# Patient Record
Sex: Female | Born: 1995 | Race: White | Hispanic: No | Marital: Single | Smoking: Current every day smoker
Health system: Southern US, Community
[De-identification: ages and names within clinical notes are randomized; demographics above are authoritative.]

## PROBLEM LIST (undated history)

## (undated) HISTORY — PX: NO PAST SURGERIES: SHX2092

---

## 2017-12-27 ENCOUNTER — Ambulatory Visit: Payer: BLUE CROSS/BLUE SHIELD | Admitting: Allergy and Immunology

## 2017-12-27 ENCOUNTER — Encounter (HOSPITAL_COMMUNITY): Payer: Self-pay

## 2017-12-27 ENCOUNTER — Emergency Department (HOSPITAL_COMMUNITY)
Admission: EM | Admit: 2017-12-27 | Discharge: 2017-12-28 | Disposition: A | Discharge status: Home / Self Care | Payer: BLUE CROSS/BLUE SHIELD | Attending: Emergency Medicine | Admitting: Emergency Medicine

## 2017-12-27 ENCOUNTER — Encounter: Payer: Self-pay | Admitting: Allergy and Immunology

## 2017-12-27 ENCOUNTER — Other Ambulatory Visit: Payer: Self-pay

## 2017-12-27 VITALS — BP 118/64 | HR 98 | Temp 97.7°F | Resp 18 | Ht 62.2 in | Wt 140.4 lb

## 2017-12-27 DIAGNOSIS — F17210 Nicotine dependence, cigarettes, uncomplicated: Secondary | ICD-10-CM | POA: Diagnosis not present

## 2017-12-27 DIAGNOSIS — J3089 Other allergic rhinitis: Secondary | ICD-10-CM | POA: Insufficient documentation

## 2017-12-27 DIAGNOSIS — L500 Allergic urticaria: Secondary | ICD-10-CM | POA: Diagnosis not present

## 2017-12-27 DIAGNOSIS — T7840XA Allergy, unspecified, initial encounter: Secondary | ICD-10-CM

## 2017-12-27 DIAGNOSIS — R1031 Right lower quadrant pain: Secondary | ICD-10-CM | POA: Diagnosis present

## 2017-12-27 DIAGNOSIS — R110 Nausea: Secondary | ICD-10-CM | POA: Insufficient documentation

## 2017-12-27 DIAGNOSIS — J310 Chronic rhinitis: Secondary | ICD-10-CM | POA: Diagnosis not present

## 2017-12-27 LAB — COMPREHENSIVE METABOLIC PANEL
ALT: 11 U/L — AB (ref 14–54)
AST: 19 U/L (ref 15–41)
Albumin: 4.8 g/dL (ref 3.5–5.0)
Alkaline Phosphatase: 68 U/L (ref 38–126)
Anion gap: 10 (ref 5–15)
BUN: 10 mg/dL (ref 6–20)
CHLORIDE: 105 mmol/L (ref 101–111)
CO2: 23 mmol/L (ref 22–32)
CREATININE: 0.88 mg/dL (ref 0.44–1.00)
Calcium: 10.1 mg/dL (ref 8.9–10.3)
GFR calc Af Amer: >60 mL/min (ref >60)
GFR calc non Af Amer: >60 mL/min (ref >60)
Glucose, Bld: 102 mg/dL — ABNORMAL HIGH (ref 65–99)
Potassium: 4 mmol/L (ref 3.5–5.1)
Sodium: 138 mmol/L (ref 135–145)
Total Bilirubin: 0.7 mg/dL (ref 0.3–1.2)
Total Protein: 8.6 g/dL — ABNORMAL HIGH (ref 6.5–8.1)

## 2017-12-27 LAB — LIPASE, BLOOD: LIPASE: 27 U/L (ref 11–51)

## 2017-12-27 LAB — I-STAT BETA HCG BLOOD, ED (MC, WL, AP ONLY)

## 2017-12-27 LAB — CBC
HCT: 43.8 % (ref 36.0–46.0)
Hemoglobin: 15.6 g/dL — ABNORMAL HIGH (ref 12.0–15.0)
MCH: 30.4 pg (ref 26.0–34.0)
MCHC: 35.6 g/dL (ref 30.0–36.0)
MCV: 85.4 fL (ref 78.0–100.0)
PLATELETS: 323 10*3/uL (ref 150–400)
RBC: 5.13 MIL/uL — ABNORMAL HIGH (ref 3.87–5.11)
RDW: 12 % (ref 11.5–15.5)
WBC: 9 10*3/uL (ref 4.0–10.5)

## 2017-12-27 MED ORDER — ONDANSETRON HCL 4 MG/2ML IJ SOLN
4.0000 mg | Freq: Once | INTRAMUSCULAR | Status: AC
  Administered 2017-12-28: 4 mg via INTRAVENOUS
  Filled 2017-12-27: qty 2

## 2017-12-27 MED ORDER — MORPHINE SULFATE (PF) 4 MG/ML IV SOLN
4.0000 mg | Freq: Once | INTRAVENOUS | Status: AC
  Administered 2017-12-28: 4 mg via INTRAVENOUS
  Filled 2017-12-27: qty 1

## 2017-12-27 MED ORDER — EPINEPHRINE 0.3 MG/0.3ML IJ SOAJ: 0.3000 mg | Freq: Once | INTRAMUSCULAR | 1 refills | Status: AC

## 2017-12-27 NOTE — ED Triage Notes (Signed)
States for about 2 days right lower quad pain worse tonight no fever noted states severe pain. No dysuria or vag discharge noted.

## 2017-12-27 NOTE — ED Provider Notes (Signed)
[facility]-EMERGENCY DEPT Provider Note   CSN: [id number] Arrival date & time: 12/27/17  2307     History   Chief Complaint Chief Complaint  Patient presents with  . Abdominal Pain    HPI Savannah Schmidt is a 22 y.o. female.  The history is provided by the patient and medical records.  Abdominal Pain   Associated symptoms include nausea.     22 year old female with history of seasonal allergies and chronic rhinitis, presenting to the ED with abdominal pain.  Reports 2 days ago she had some mildly diffuse abdominal pain which she thought was due to gas.  States over the past 24 hours pain seems more localized to her right lower abdomen.  States she was okay this morning but throughout the day today pain has significantly worsened and she had to leave work early.  States riding in the car was almost unbearable as she hit bumps in the road.  Pain also worse with standing upright and walking around, better when curled into the fetal position.  She reports some nausea and decreased appetite but no vomiting or diarrhea.  Had a fever last week secondary to a cold but that is resolved.  She denies any urinary symptoms, pelvic pain, or vaginal discharge.  No prior abdominal surgeries.  History reviewed. No pertinent past medical history.  Patient Active Problem List   Diagnosis Date Noted  . 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 Allergic reaction 12/27/2017  . Chronic rhinitis 12/27/2017    Past Surgical History:  Procedure Laterality Date  . NO PAST SURGERIES      OB History    No data available       Home Medications    Prior to Admission medications   Medication Sig Start Date End Date Taking? Authorizing Provider  cetirizine (ZYRTEC) 10 MG tablet Take 10 mg by mouth daily as needed for allergies.    [provider]  diphenhydrAMINE (BENADRYL) 25 MG tablet Take 50 mg by mouth every 4 (four) hours as needed for allergies (for allergic reaction).     [provider]  EPINEPHrine (AUVI-Q) 0.3 mg/0.3 mL IJ SOAJ injection Inject 0.3 mLs (0.3 mg total) into the muscle once for 1 dose. 12/27/17 12/27/17  Bobbitt, Heywood Iles, MD    Family History Family History  Problem Relation Age of Onset  . Allergic rhinitis Mother   . Allergic rhinitis Sister     Social History Social History   Tobacco Use  . Smoking status: Current Every Day Smoker    Packs/day: 0.50    Years: 5.00    Pack years: 2.50    Types: Cigarettes  . Smokeless tobacco: Never Used  Substance Use Topics  . Alcohol use: Yes    Alcohol/week: 4.8 oz    Types: 8 Glasses of wine per week  . Drug use: Yes    Types: Cocaine     Allergies   Azithromycin and Levaquin [levofloxacin]   Review of Systems Review of Systems  Constitutional: Positive for appetite change.  Gastrointestinal: Positive for abdominal pain and nausea.  All other systems reviewed and are negative.    Physical Exam Updated Vital Signs BP 117/86   Pulse 91   Temp 97.8 F (36.6 C) (Oral)   Resp (!) 24   Ht 5\' 2"  (1.575 m)   Wt 63.5 kg (140 lb)   SpO2 100%   BMI 25.61 kg/m   Physical Exam  Constitutional: She is oriented to person, place, and  time. She appears well-developed and well-nourished.  HENT:  Head: Normocephalic and atraumatic.  Mouth/Throat: Oropharynx is clear and moist.  Eyes: Conjunctivae and EOM are normal. Pupils are equal, round, and reactive to light.  Neck: Normal range of motion.  Cardiovascular: Normal rate, regular rhythm and normal heart sounds.  Pulmonary/Chest: Effort normal and breath sounds normal.  Abdominal: Soft. Bowel sounds are normal. There is tenderness in the right lower quadrant. There is tenderness at McBurney's point.  Musculoskeletal: Normal range of motion.  Neurological: She is alert and oriented to person, place, and time.  Skin: Skin is warm and dry.  Psychiatric: She has a normal mood and affect.  Nursing note and vitals  reviewed.    ED Treatments / Results  Labs (all labs ordered are listed, but only abnormal results are displayed) Labs Reviewed  COMPREHENSIVE METABOLIC PANEL - Abnormal; Notable for the following components:      Result Value   Glucose, Bld 102 (*)    Total Protein 8.6 (*)    ALT 11 (*)    All other components within normal limits  CBC - Abnormal; Notable for the following components:   RBC 5.13 (*)    Hemoglobin 15.6 (*)    All other components within normal limits  LIPASE, BLOOD  URINALYSIS, ROUTINE W REFLEX MICROSCOPIC  I-STAT BETA HCG BLOOD, ED ([facility], [facility], [facility] ONLY)    EKG  EKG Interpretation None       Radiology Ct Abdomen Pelvis W Contrast  Result Date: 12/28/2017 CLINICAL DATA:  Two day history of right lower quadrant pain, worse tonight. EXAM: CT ABDOMEN AND PELVIS WITH CONTRAST TECHNIQUE: Multidetector CT imaging of the abdomen and pelvis was performed using the standard protocol following bolus administration of intravenous contrast. CONTRAST:  100mL ISOVUE-300 IOPAMIDOL (ISOVUE-300) INJECTION 61% COMPARISON:  None. FINDINGS: Lower chest: Lung bases are clear. Hepatobiliary: No focal liver abnormality is seen. No gallstones, gallbladder wall thickening, or biliary dilatation. Pancreas: Unremarkable. No pancreatic ductal dilatation or surrounding inflammatory changes. Spleen: Normal in size without focal abnormality. Adrenals/Urinary Tract: Adrenal glands are unremarkable. Kidneys are normal, without renal calculi, focal lesion, or hydronephrosis. Bladder is unremarkable. Stomach/Bowel: Stomach, small bowel, and colon are not abnormally distended. No wall thickening is appreciated. No inflammatory stranding. Appendix is normal. Vascular/Lymphatic: No significant vascular findings are present. No enlarged abdominal or pelvic lymph nodes. Reproductive: Uterus and bilateral adnexa are unremarkable. Other: No abdominal wall hernia or abnormality. No abdominopelvic ascites.  Musculoskeletal: No acute or significant osseous findings. IMPRESSION: No acute process demonstrated in the abdomen or pelvis. No evidence of bowel obstruction or inflammation. Appendix is normal. Electronically Signed   By: Burman NievesWilliam  Stevens M.D.   On: 12/28/2017 00:49    Procedures Procedures (including critical care time)  Medications Ordered in ED Medications  morphine 4 MG/ML injection 4 mg (4 mg Intravenous Given 12/28/17 0000)  ondansetron (ZOFRAN) injection 4 mg (4 mg Intravenous Given 12/28/17 0000)  iopamidol (ISOVUE-300) 61 % injection (100 mLs  Contrast Given 12/28/17 0012)     Initial Impression / Assessment and Plan / ED Course  I have reviewed the triage vital signs and the nursing notes.  Pertinent labs & imaging results that were available during my care of the patient were reviewed by me and considered in my medical decision making (see chart for details).  22 year old female here with right lower quadrant abdominal pain over the past 2 days.  Has been progressively worsening.  No urinary symptoms, pelvic pain, or  vaginal discharge.  She is afebrile and nontoxic but does appear uncomfortable.  Tenderness in the right lower quadrant on exam.  Screening labs overall reassuring.  CT scan was obtained, no acute findings.  Patient feeling better after medications here.  Feel she is stable for discharge home.  She understands to monitor her symptoms over the next few days, seek reevaluation if any new or acute changes.  She was discharged home in stable condition.  Final Clinical Impressions(s) / ED Diagnoses   Final diagnoses:  Right lower quadrant abdominal pain    ED Discharge Orders        Ordered    ibuprofen (ADVIL,MOTRIN) 800 MG tablet  3 times daily     12/28/17 0150    ondansetron (ZOFRAN ODT) 4 MG disintegrating tablet  Every 8 hours PRN     12/28/17 0150       Garlon Hatchet, PA-C 12/28/17 1610    Devoria Albe, MD 12/28/17 [phone]

## 2017-12-27 NOTE — Assessment & Plan Note (Signed)
The patient's history suggests allergic rhinoconjunctivitis.  Recommendations will be made after she returns for allergy skin testing in the near future.

## 2017-12-27 NOTE — ED Notes (Signed)
Bed: [id number] Expected date:  Expected time:  Means of arrival:  Comments: Triage 2 Michelle

## 2017-12-27 NOTE — Assessment & Plan Note (Signed)
The patient's history suggests anaphylaxis with an unclear etiology.  Because she is in the 6-week refractory period of this reaction we were unable to proceed with skin testing today.  She will return to the clinic for the skin testing after she is outside of the 6 weeks and off of antihistamines for at least 3 days.  An emergency allergy action plan has been provided and discussed.  A prescription has been provided for epinephrine 0.3 mg autoinjector 2 pack along with instructions for its proper administration.  Should symptoms recur, a journal is to be kept recording any foods eaten, beverages consumed, and medications taken within a 6 hour time period prior to the onset of symptoms, as well as record activities being performed, and environmental conditions. For any symptoms concerning for anaphylaxis, epinephrine is to be administered and 911 is to be called immediately.

## 2017-12-27 NOTE — Patient Instructions (Addendum)
Allergic reaction The patient's history suggests anaphylaxis with an unclear etiology.  Because she is in the 6-week refractory period of this reaction we were unable to proceed with skin testing today.  She will return to the clinic for the skin testing after she is outside of the 6 weeks and off of antihistamines for at least 3 days.  An emergency allergy action plan has been provided and discussed.  A prescription has been provided for epinephrine 0.3 mg autoinjector 2 pack along with instructions for its proper administration.  Should symptoms recur, a journal is to be kept recording any foods eaten, beverages consumed, and medications taken within a 6 hour time period prior to the onset of symptoms, as well as record activities being performed, and environmental conditions. For any symptoms concerning for anaphylaxis, epinephrine is to be administered and 911 is to be called immediately.  Chronic rhinitis The patient's history suggests allergic rhinoconjunctivitis.  Recommendations will be made after she returns for allergy skin testing in the near future.   Return for allergy skin testing.

## 2017-12-27 NOTE — Progress Notes (Signed)
New Patient Note  RE: Savannah Schmidt Beery MRN: [id number] DOB: 01-Nov-1996 Date of Office Visit: 12/27/2017  Referring provider: No ref. provider found Primary care provider: Patient, No Pcp Per  Chief Complaint: Allergic Reaction; Urticaria; and Allergic Rhinitis    History of present illness: Savannah Schmidt Cove is a 22 y.o. female presenting today for evaluation of an allergic reaction.  She reports that during the evening 3 weeks ago she consumed sourdough bread while working at [employer] where she works.  After consuming the bread, she states that her "whole face broke out in hives" and that her face and neck were "burning and swollen" in addition, she developed the sensation of throat tightness and difficulty breathing.  She took 100 mg of diphenhydramine and the symptoms began to resolve over the course of the next hour, however her face was still blotchy a few hours later.  She did not go to the emergency department because she was concerned about losing her job.  She reports that she had not consumed any food earlier in the day.  She denies having taken any medication that day and denies insect sting/bites.  Since that time, she has tried to consume small amounts of bread or crackers and has gotten "minor digestive issues", such as gas, bloating, and constipation.  She does state that over the past year when she consumes a large quantity of bread her hands become itchy and swollen. Rachel experiences severe nasal congestion, ocular pruritus, and periorbital edema.  The symptoms occur predominantly in the spring and the fall.  She attempts to control the symptoms with cetirizine and over-the-counter eyedrops.   Assessment and plan: Allergic reaction The patient's history suggests anaphylaxis with an unclear etiology.  Because she is in the 6-week refractory period of this reaction we were unable to proceed with skin testing today.  She will return to the clinic for the skin testing after she is  outside of the 6 weeks and off of antihistamines for at least 3 days.  An emergency allergy action plan has been provided and discussed.  A prescription has been provided for epinephrine 0.3 mg autoinjector 2 pack along with instructions for its proper administration.  Should symptoms recur, a journal is to be kept recording any foods eaten, beverages consumed, and medications taken within a 6 hour time period prior to the onset of symptoms, as well as record activities being performed, and environmental conditions. For any symptoms concerning for anaphylaxis, epinephrine is to be administered and 911 is to be called immediately.  Chronic rhinitis The patient's history suggests allergic rhinoconjunctivitis.  Recommendations will be made after she returns for allergy skin testing in the near future.   Meds ordered this encounter  Medications  . EPINEPHrine (AUVI-Q) 0.3 mg/0.3 mL IJ SOAJ injection    Sig: Inject 0.3 mLs (0.3 mg total) into the muscle once for 1 dose.    Dispense:  2 Device    Refill:  1    Physical examination: Blood pressure 118/64, pulse 98, temperature 97.7 F (36.5 C), temperature source Oral, resp. rate 18, height 5' 2.2" (1.58 m), weight 140 lb 6.4 oz (63.7 kg), SpO2 98 %.  General: Alert, interactive, in no acute distress. HEENT: TMs pearly gray, turbinates moderately edematous with clear discharge, post-pharynx mildly erythematous. Neck: Supple without lymphadenopathy. Lungs: Clear to auscultation without wheezing, rhonchi or rales. CV: Normal S1, S2 without murmurs. Abdomen: Nondistended, nontender. Skin: Warm and dry, without lesions or rashes. Extremities:  No clubbing, cyanosis  or edema. Neuro:   Grossly intact.  Review of systems:  Review of systems negative except as noted in HPI / PMHx or noted below: Review of Systems  Constitutional: Negative.   HENT: Negative.   Eyes: Negative.   Respiratory: Negative.   Cardiovascular: Negative.     Gastrointestinal: Negative.   Genitourinary: Negative.   Musculoskeletal: Negative.   Skin: Negative.   Neurological: Negative.   Endo/Heme/Allergies: Negative.   Psychiatric/Behavioral: Negative.     Past medical history:  History reviewed. No pertinent past medical history.  Past surgical history:  Past Surgical History:  Procedure Laterality Date  . NO PAST SURGERIES      Family history: Family History  Problem Relation Age of Onset  . Allergic rhinitis Mother   . Allergic rhinitis Sister     Social history: Social History   Socioeconomic History  . Marital status: Single    Spouse name: Not on file  . Number of children: Not on file  . Years of education: Not on file  . Highest education level: Not on file  Social Needs  . Financial resource strain: Not on file  . Food insecurity - worry: Not on file  . Food insecurity - inability: Not on file  . Transportation needs - medical: Not on file  . Transportation needs - non-medical: Not on file  Occupational History    Employer: [employer]  Tobacco Use  . Smoking status: Current Every Day Smoker    Packs/day: 0.50    Years: 5.00    Pack years: 2.50    Types: Cigarettes  . Smokeless tobacco: Never Used  Substance and Sexual Activity  . Alcohol use: Yes    Alcohol/week: 4.8 oz    Types: 8 Glasses of wine per week  . Drug use: Yes    Types: Cocaine  . Sexual activity: Yes  Other Topics Concern  . Not on file  Social History Narrative  . Not on file   Environmental History: The patient lives in a 24 year old house with carpeting throughout and central air/heat.  There is a dog in the home which has access to her bedroom.  She is a former cigarette smoker having quit in 2013.  Allergies as of 12/27/2017      Reactions   Azithromycin       Medication List        Accurate as of 12/27/17  6:54 PM. Always use your most recent med list.          cetirizine 10 MG tablet Commonly known as:   ZYRTEC Take 10 mg by mouth daily as needed for allergies.   diphenhydrAMINE 25 MG tablet Commonly known as:  BENADRYL Take 50 mg by mouth every 4 (four) hours as needed for allergies (for allergic reaction).   EPINEPHrine 0.3 mg/0.3 mL Soaj injection Commonly known as:  AUVI-Q Inject 0.3 mLs (0.3 mg total) into the muscle once for 1 dose.       Known medication allergies: Allergies  Allergen Reactions  . Azithromycin     I appreciate the opportunity to take part in Dameisha's care. Please do not hesitate to contact me with questions.  Sincerely,   R. Jorene Guest, MD

## 2017-12-28 ENCOUNTER — Emergency Department (HOSPITAL_COMMUNITY): Payer: BLUE CROSS/BLUE SHIELD

## 2017-12-28 MED ORDER — IOPAMIDOL (ISOVUE-300) INJECTION 61%
INTRAVENOUS | Status: AC
Start: 2017-12-28 — End: 2017-12-28
  Administered 2017-12-28: 100 mL
  Filled 2017-12-28: qty 100

## 2017-12-28 MED ORDER — ONDANSETRON 4 MG PO TBDP: 4.0000 mg | ORAL_TABLET | Freq: Three times a day (TID) | ORAL | 0 refills | Status: AC | PRN

## 2017-12-28 MED ORDER — IBUPROFEN 800 MG PO TABS: 800.0000 mg | ORAL_TABLET | Freq: Three times a day (TID) | ORAL | 0 refills | Status: AC

## 2017-12-28 NOTE — Discharge Instructions (Signed)
Keep an eye on your symptoms, take medications to help with them. Follow-up with your primary care doctor. You can return here for any new/acute changes.

## 2018-01-17 ENCOUNTER — Ambulatory Visit: Payer: BLUE CROSS/BLUE SHIELD | Admitting: Allergy and Immunology

## 2018-01-17 ENCOUNTER — Encounter: Payer: Self-pay | Admitting: Allergy and Immunology

## 2018-01-17 VITALS — BP 110/60 | HR 98 | Temp 97.7°F | Resp 18

## 2018-01-17 DIAGNOSIS — T7840XD Allergy, unspecified, subsequent encounter: Secondary | ICD-10-CM

## 2018-01-17 DIAGNOSIS — H1013 Acute atopic conjunctivitis, bilateral: Secondary | ICD-10-CM | POA: Diagnosis not present

## 2018-01-17 DIAGNOSIS — J3089 Other allergic rhinitis: Secondary | ICD-10-CM | POA: Diagnosis not present

## 2018-01-17 DIAGNOSIS — L500 Allergic urticaria: Secondary | ICD-10-CM | POA: Diagnosis not present

## 2018-01-17 DIAGNOSIS — H1010 Acute atopic conjunctivitis, unspecified eye: Secondary | ICD-10-CM | POA: Insufficient documentation

## 2018-01-17 MED ORDER — OLOPATADINE HCL 0.7 % OP SOLN: 1.0000 [drp] | OPHTHALMIC | 5 refills | Status: AC

## 2018-01-17 MED ORDER — LEVOCETIRIZINE DIHYDROCHLORIDE 5 MG PO TABS: 5.0000 mg | ORAL_TABLET | Freq: Every evening | ORAL | 5 refills | Status: AC

## 2018-01-17 MED ORDER — EPINEPHRINE 0.3 MG/0.3ML IJ SOAJ: 0.3000 mg | Freq: Once | INTRAMUSCULAR | 1 refills | Status: AC

## 2018-01-17 MED ORDER — FLUTICASONE PROPIONATE 93 MCG/ACT NA EXHU: 2.0000 | INHALANT_SUSPENSION | Freq: Two times a day (BID) | NASAL | 5 refills | Status: DC

## 2018-01-17 NOTE — Assessment & Plan Note (Addendum)
   Aeroallergen avoidance measures have been discussed and provided in written form.  A prescription has been provided for levocetirizine, 5 mg daily as needed.  A prescription has been provided for Xhance, 2 actuations per nostril twice a day. Proper technique has been discussed and demonstrated.  Nasal saline spray (i.e., Simply Saline) or nasal saline lavage (i.e., NeilMed) is recommended as needed and prior to medicated nasal sprays.  The risks and benefits of aeroallergen immunotherapy have been discussed. The patient is interested in the possibility of initiating immunotherapy if insurance coverage is favorable. She will let us know how she would like to proceed. 

## 2018-01-17 NOTE — Assessment & Plan Note (Signed)
   Treatment plan as outlined above for allergic rhinitis.  A prescription has been provided for Pazeo, one drop per eye daily as needed.  I have also recommended eye lubricant drops (i.e., Natural Tears) as needed. 

## 2018-01-17 NOTE — Assessment & Plan Note (Addendum)
The patients history suggests allergic reaction with an unclear trigger. With the exception of borderling/equivocal reactivity to hops, food allergen skin tests were negative today despite a positive histamine control. As the patient is able to consume foods/beverages with hops without symptoms, this represents a false positive result. We will proceed with in vitro lab studies to help establish an etiology.  The following labs have been ordered: FCeRI antibody, TSH, anti-thyroglobulin antibody, thyroid peroxidase antibody, baseline serum tryptase, CBC, CMP, ESR, ANA, and serum specific IgE against galactose-alpha-1,3-galactose panel.   Should symptoms recur, a journal is to be kept recording any foods eaten, beverages consumed, medications taken within a 6 hour period prior to the onset of symptoms, as well as activities performed, and environmental conditions. For any symptoms concerning for anaphylaxis, epinephrine is to be administered and 911 is to be called immediately.  A prescription has been provided for epinephrine autoinjector 2 pack along with instructions for its proper administration.

## 2018-01-17 NOTE — Patient Instructions (Addendum)
Allergic reaction The patients history suggests allergic reaction with an unclear trigger. With the exception of borderling/equivocal reactivity to hops, food allergen skin tests were negative today despite a positive histamine control. As the patient is able to consume foods/beverages with hops without symptoms, this represents a false positive result. We will proceed with in vitro lab studies to help establish an etiology.  The following labs have been ordered: FCeRI antibody, TSH, anti-thyroglobulin antibody, thyroid peroxidase antibody, baseline serum tryptase, CBC, CMP, ESR, ANA, and serum specific IgE against galactose-alpha-1,3-galactose panel.   Should symptoms recur, a journal is to be kept recording any foods eaten, beverages consumed, medications taken within a 6 hour period prior to the onset of symptoms, as well as activities performed, and environmental conditions. For any symptoms concerning for anaphylaxis, epinephrine is to be administered and 911 is to be called immediately.  A prescription has been provided for epinephrine autoinjector 2 pack along with instructions for its proper administration.  Perennial and seasonal allergic rhinitis  Aeroallergen avoidance measures have been discussed and provided in written form.  A prescription has been provided for levocetirizine, 5 mg daily as needed.  A prescription has been provided for Xhance, 2 actuations per nostril twice a day. Proper technique has been discussed and demonstrated.  Nasal saline spray (i.e., Simply Saline) or nasal saline lavage (i.e., NeilMed) is recommended as needed and prior to medicated nasal sprays.  The risks and benefits of aeroallergen immunotherapy have been discussed. The patient is interested in the possibility of initiating immunotherapy if insurance coverage is favorable. She will let us know how she would like to proceed.  Allergic conjunctivitis  Treatment plan as outlined above for allergic  rhinitis.  A prescription has been provided for Pazeo, one drop per eye daily as needed.  I have also recommended eye lubricant drops (i.e., Natural Tears) as needed.   When lab results have returned the patient will be called with further recommendations and follow up instructions.  Reducing Pollen Exposure  The American Academy of Allergy, Asthma and Immunology suggests the following steps to reduce your exposure to pollen during allergy seasons.    1. Do not hang sheets or clothing out to dry; pollen may collect on these items. 2. Do not mow lawns or spend time around freshly cut grass; mowing stirs up pollen. 3. Keep windows closed at night.  Keep car windows closed while driving. 4. Minimize morning activities outdoors, a time when pollen counts are usually at their highest. 5. Stay indoors as much as possible when pollen counts or humidity is high and on windy days when pollen tends to remain in the air longer. 6. Use air conditioning when possible.  Many air conditioners have filters that trap the pollen spores. 7. Use a HEPA room air filter to remove pollen form the indoor air you breathe.   Control of Mold Allergen  Mold and fungi can grow on a variety of surfaces provided certain temperature and moisture conditions exist.  Outdoor molds grow on plants, decaying vegetation and soil.  The major outdoor mold, Alternaria and Cladosporium, are found in very high numbers during hot and dry conditions.  Generally, a late Summer - Fall peak is seen for common outdoor fungal spores.  Rain will temporarily lower outdoor mold spore count, but counts rise rapidly when the rainy period ends.  The most important indoor molds are Aspergillus and Penicillium.  Dark, humid and poorly ventilated basements are ideal sites for mold growth.  The next   most common sites of mold growth are the bathroom and the kitchen.  Outdoor Mold Control 1. Use air conditioning and keep windows closed 2. Avoid  exposure to decaying vegetation. 3. Avoid leaf raking. 4. Avoid grain handling. 5. Consider wearing a face mask if working in moldy areas.  Indoor Mold Control 1. Maintain humidity below 50%. 2. Clean washable surfaces with 5% bleach solution. 3. Remove sources e.g. Contaminated carpets.  Control of Dog or Cat Allergen  Avoidance is the best way to manage a dog or cat allergy. If you have a dog or cat and are allergic to dog or cats, consider removing the dog or cat from the home. If you have a dog or cat but don't want to find it a new home, or if your family wants a pet even though someone in the household is allergic, here are some strategies that may help keep symptoms at bay:  1. Keep the pet out of your bedroom and restrict it to only a few rooms. Be advised that keeping the dog or cat in only one room will not limit the allergens to that room. 2. Don't pet, hug or kiss the dog or cat; if you do, wash your hands with soap and water. 3. High-efficiency particulate air (HEPA) cleaners run continuously in a bedroom or living room can reduce allergen levels over time. 4. Place electrostatic material sheet in the air inlet vent in the bedroom. 5. Regular use of a high-efficiency vacuum cleaner or a central vacuum can reduce allergen levels. 6. Giving your dog or cat a bath at least once a week can reduce airborne allergen.   

## 2018-01-17 NOTE — Progress Notes (Signed)
Follow-up Note  RE: Savannah Schmidt MRN: [id number] DOB: [birth year] Date of Office Visit: 01/17/2018  Primary care provider: Patient, No Pcp Per Referring provider: No ref. provider found  History of present illness: Savannah Schmidt is a 22 y.o. female with a recent history of allergic reaction and chronic rhinitis presenting today for follow-up and allergy skin testing.  She was previously seen in this clinic for her initial evaluation on December 27, 2017.  Please see history of present illness from January 30 for details.   Assessment and plan: Allergic reaction The patients history suggests allergic reaction with an unclear trigger. With the exception of borderling/equivocal reactivity to hops, food allergen skin tests were negative today despite a positive histamine control. As the patient is able to consume foods/beverages with hops without symptoms, this represents a false positive result. We will proceed with in vitro lab studies to help establish an etiology.  The following labs have been ordered: FCeRI antibody, TSH, anti-thyroglobulin antibody, thyroid peroxidase antibody, baseline serum tryptase, CBC, CMP, ESR, ANA, and serum specific IgE against galactose-alpha-1,3-galactose panel.   Should symptoms recur, a journal is to be kept recording any foods eaten, beverages consumed, medications taken within a 6 hour period prior to the onset of symptoms, as well as activities performed, and environmental conditions. For any symptoms concerning for anaphylaxis, epinephrine is to be administered and 911 is to be called immediately.  A prescription has been provided for epinephrine autoinjector 2 pack along with instructions for its proper administration.  Perennial and seasonal allergic rhinitis  Aeroallergen avoidance measures have been discussed and provided in written form.  A prescription has been provided for levocetirizine, 5 mg daily as needed.  A prescription has been provided  for [facility], 2 actuations per nostril twice a day. Proper technique has been discussed and demonstrated.  Nasal saline spray (i.e., Simply Saline) or nasal saline lavage (i.e., NeilMed) is recommended as needed and prior to medicated nasal sprays.  The risks and benefits of aeroallergen immunotherapy have been discussed. The patient is interested in the possibility of initiating immunotherapy if insurance coverage is favorable. She will let us know how she would like to proceed.  Allergic conjunctivitis  Treatment plan as outlined above for allergic rhinitis.  A prescription has been provided for Pazeo, one drop per eye daily as needed.  I have also recommended eye lubricant drops (i.e., Natural Tears) as needed.   Meds ordered this encounter  Medications  . EPINEPHrine (AUVI-Q) 0.3 mg/0.3 mL IJ SOAJ injection    Sig: Inject 0.3 mLs (0.3 mg total) into the muscle once for 1 dose.    Dispense:  2 Device    Refill:  1  . levocetirizine (XYZAL) 5 MG tablet    Sig: Take 1 tablet (5 mg total) by mouth every evening.    Dispense:  30 tablet    Refill:  5  . Fluticasone Propionate (XHANCE) 93 MCG/ACT EXHU    Sig: Place 2 puffs into the nose 2 (two) times daily.    Dispense:  32 mL    Refill:  5  . Olopatadine HCl (PAZEO) 0.7 % SOLN    Sig: Place 1 drop into both eyes 1 day or 1 dose.    Dispense:  1 Bottle    Refill:  5    Diagnostics: Epicutaneous environmental skin testing: Positive to grass pollen, weed pollen, ragweed pollen, tree pollen, molds, and cat hair. Intradermal environmental skin testing: Positive to tree pollens, molds, and dog  epithelia. Food allergen skin testing: Borderline positive/equivocal to hops.    Physical examination: Blood pressure 110/60, pulse 98, temperature 97.7 F (36.5 C), temperature source Oral, resp. rate 18, last menstrual period 12/21/2017, SpO2 95 %.  General: Alert, interactive, in no acute distress. HEENT: TMs pearly gray, turbinates  edematous with thick discharge, post-pharynx erythematous. Neck: Supple without lymphadenopathy. Lungs: Clear to auscultation without wheezing, rhonchi or rales. CV: Normal S1, S2 without murmurs. Skin: Warm and dry, without lesions or rashes.  The following portions of the patient's history were reviewed and updated as appropriate: allergies, current medications, past family history, past medical history, past social history, past surgical history and problem list.  Allergies as of 01/17/2018      Reactions   Azithromycin Other (See Comments)   Childhood allergy (unknown)   Levaquin [levofloxacin] Other (See Comments)   Caused her vein to swell.       Medication List        Accurate as of 01/17/18 12:49 PM. Always use your most recent med list.          EPINEPHrine 0.3 mg/0.3 mL Soaj injection Commonly known as:  AUVI-Q Inject 0.3 mLs (0.3 mg total) into the muscle once for 1 dose.   Fluticasone Propionate 93 MCG/ACT Exhu Commonly known as:  XHANCE Place 2 puffs into the nose 2 (two) times daily.   ibuprofen 800 MG tablet Commonly known as:  ADVIL,MOTRIN Take 1 tablet (800 mg total) by mouth 3 (three) times daily.   levocetirizine 5 MG tablet Commonly known as:  XYZAL Take 1 tablet (5 mg total) by mouth every evening.   Olopatadine HCl 0.7 % Soln Commonly known as:  PAZEO Place 1 drop into both eyes 1 day or 1 dose.   ondansetron 4 MG disintegrating tablet Commonly known as:  ZOFRAN ODT Take 1 tablet (4 mg total) by mouth every 8 (eight) hours as needed for nausea.       Allergies  Allergen Reactions  . Azithromycin Other (See Comments)    Childhood allergy (unknown)  . Levaquin [Levofloxacin] Other (See Comments)    Caused her vein to swell.     I appreciate the opportunity to take part in Eraina's care. Please do not hesitate to contact me with questions.  Sincerely,   R. Edgar Frisk, MD

## 2018-01-31 ENCOUNTER — Telehealth: Payer: Self-pay | Admitting: Allergy

## 2018-01-31 NOTE — Telephone Encounter (Signed)
Called patient and informed he that [facility] was trying to get a hold of her about the [employer]. Patient said she would call today.

## 2018-07-28 ENCOUNTER — Other Ambulatory Visit: Payer: Self-pay | Admitting: Allergy and Immunology

## 2020-01-30 IMAGING — CT CT ABD-PELV W/ CM
2 of 4 series · 17 of 46 positions shown, 19 images · IV contrast (ISOVUE)
Comparison: None.

CLINICAL DATA: Two day history of right lower quadrant pain, worse
tonight.

EXAM:
CT ABDOMEN AND PELVIS WITH CONTRAST
TECHNIQUE: Multidetector CT imaging of the abdomen and pelvis was performed
using the standard protocol following bolus administration of
intravenous contrast.
CONTRAST:  100mL [id number] IOPAMIDOL ([id number]) INJECTION 61%

[Series 2: axial st · axial · 0.66mm/px · z∈[-428,-74]mm · 14 of 79 slices shown, 16 images]
[im 4/79  soft-tissue]
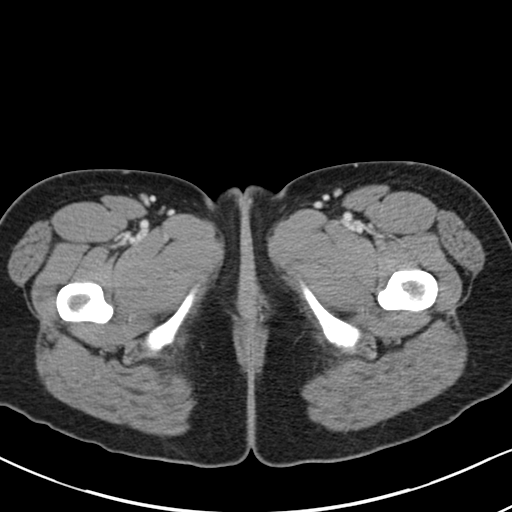
[im 4/79  bone]
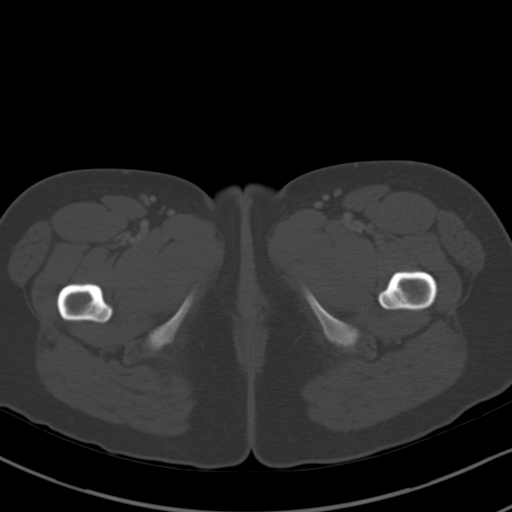
[im 12/79  soft-tissue]
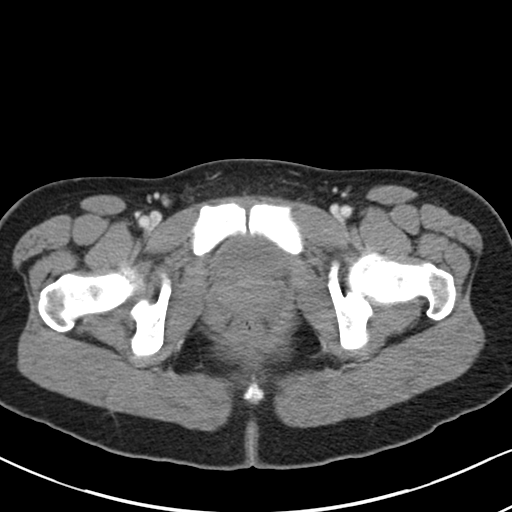
[im 16/79  soft-tissue]
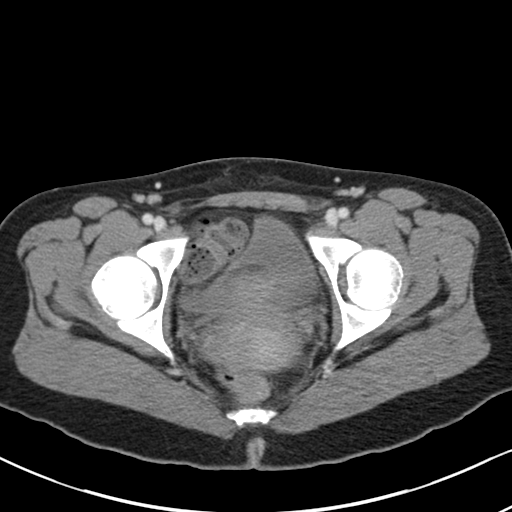
[im 20/79  soft-tissue]
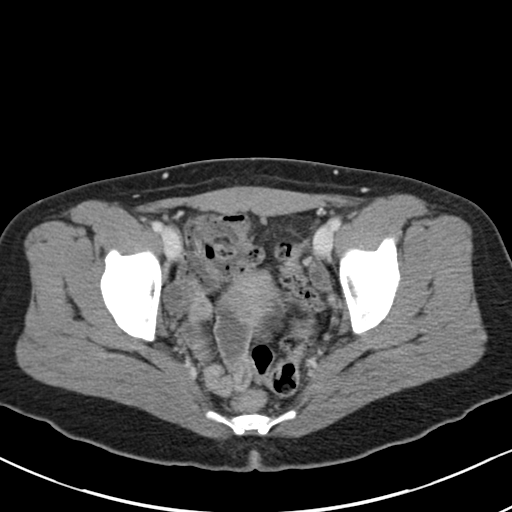
[im 28/79  soft-tissue]
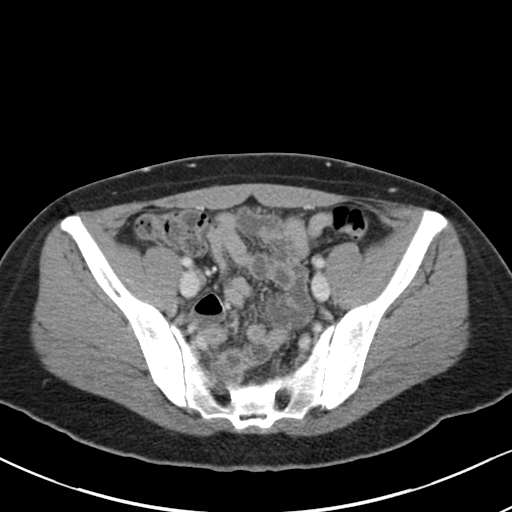
[im 32/79  soft-tissue]
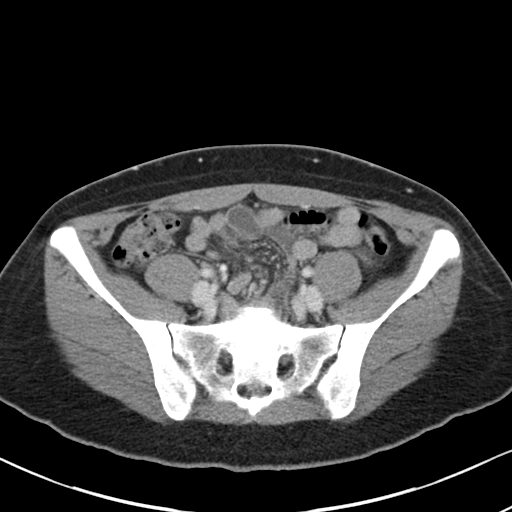
[im 36/79  soft-tissue]
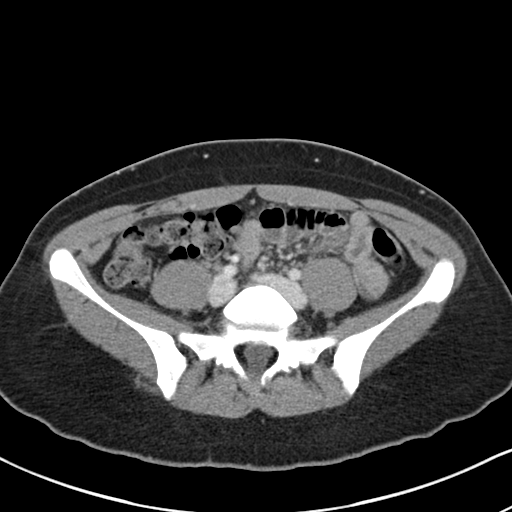
[im 43/79  soft-tissue]
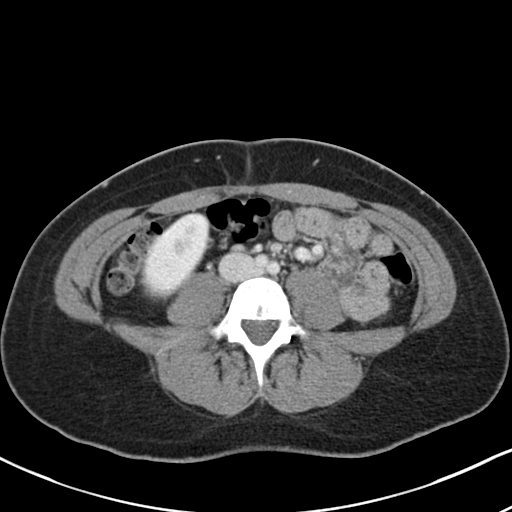
[im 47/79  soft-tissue]
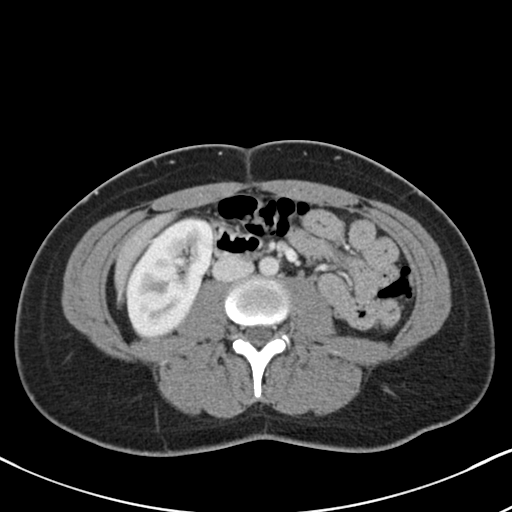
[im 47/79  bone]
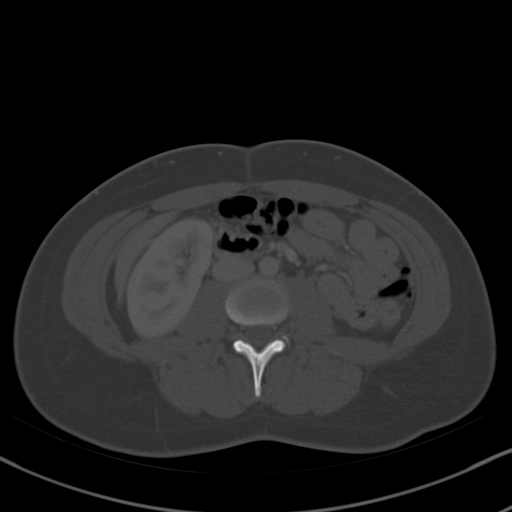
[im 51/79  soft-tissue]
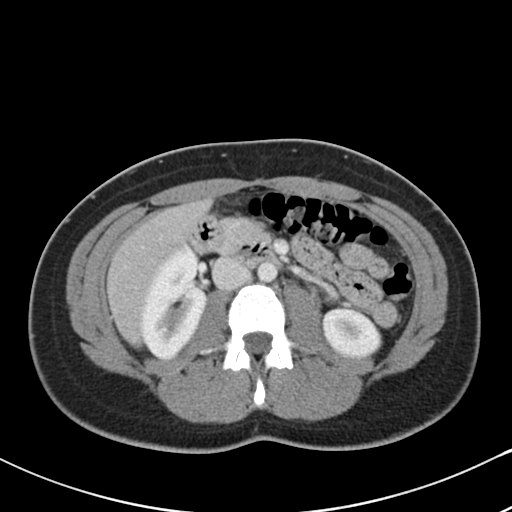
[im 59/79  soft-tissue]
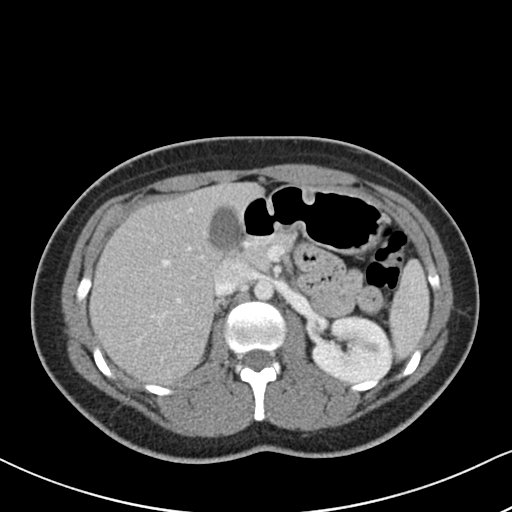
[im 63/79  soft-tissue]
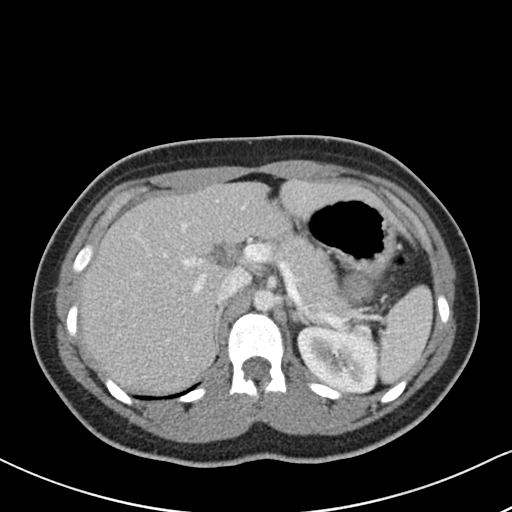
[im 67/79  soft-tissue]
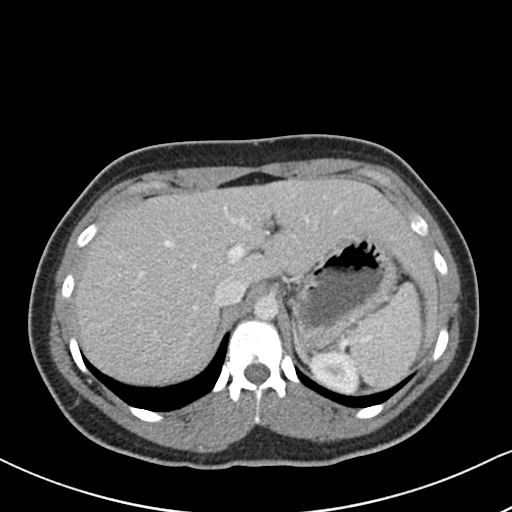
[im 75/79  soft-tissue]
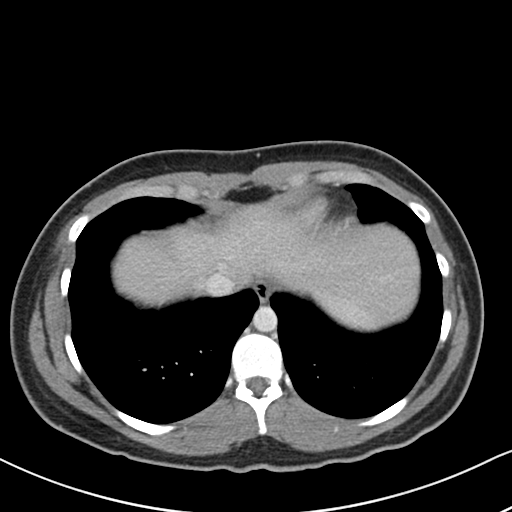

[Series 5: coronal st · coronal · 0.74mm/px · 3 of 77 slices shown]
[im 26/77  soft-tissue]
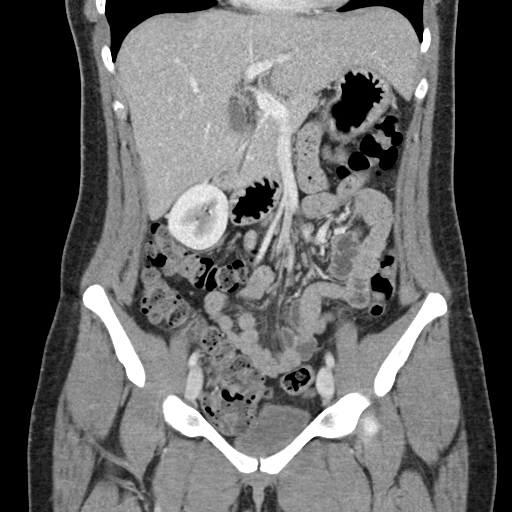
[im 34/77  soft-tissue]
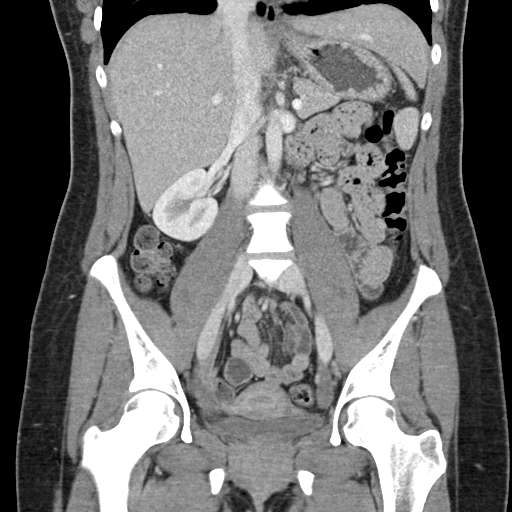
[im 43/77  soft-tissue]
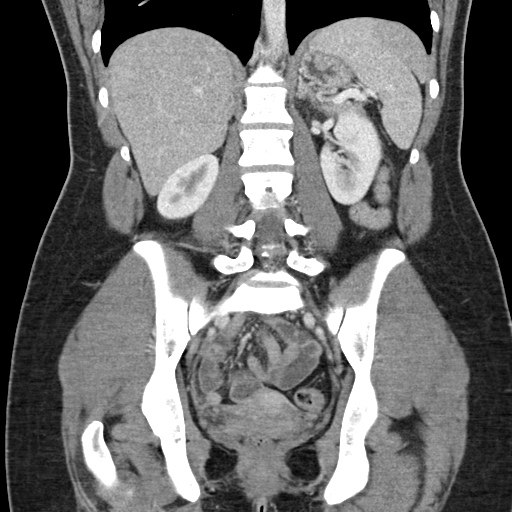

[17 of 46 positions shown; findings below may reference images not displayed]

FINDINGS: Lower chest: Lung bases are clear.

Hepatobiliary: No focal liver abnormality is seen. No gallstones,
gallbladder wall thickening, or biliary dilatation.

Pancreas: Unremarkable. No pancreatic ductal dilatation or
surrounding inflammatory changes.

Spleen: Normal in size without focal abnormality.

Adrenals/Urinary Tract: Adrenal glands are unremarkable. Kidneys are
normal, without renal calculi, focal lesion, or hydronephrosis.
Bladder is unremarkable.

Stomach/Bowel: Stomach, small bowel, and colon are not abnormally
distended. No wall thickening is appreciated. No inflammatory
stranding. Appendix is normal.

Vascular/Lymphatic: No significant vascular findings are present. No
enlarged abdominal or pelvic lymph nodes.

Reproductive: Uterus and bilateral adnexa are unremarkable.

Other: No abdominal wall hernia or abnormality. No abdominopelvic
ascites.

Musculoskeletal: No acute or significant osseous findings.
IMPRESSION: No acute process demonstrated in the abdomen or pelvis. No evidence
of bowel obstruction or inflammation. Appendix is normal.

## 2021-10-06 DIAGNOSIS — J039 Acute tonsillitis, unspecified: Secondary | ICD-10-CM | POA: Diagnosis not present

## 2021-10-25 DIAGNOSIS — Z03818 Encounter for observation for suspected exposure to other biological agents ruled out: Secondary | ICD-10-CM | POA: Diagnosis not present

## 2021-10-25 DIAGNOSIS — T23272A Burn of second degree of left wrist, initial encounter: Secondary | ICD-10-CM | POA: Diagnosis not present

## 2021-10-25 DIAGNOSIS — J029 Acute pharyngitis, unspecified: Secondary | ICD-10-CM | POA: Diagnosis not present

## 2021-10-25 DIAGNOSIS — J039 Acute tonsillitis, unspecified: Secondary | ICD-10-CM | POA: Diagnosis not present

## 2021-10-25 DIAGNOSIS — B349 Viral infection, unspecified: Secondary | ICD-10-CM | POA: Diagnosis not present

## 2021-10-25 DIAGNOSIS — M791 Myalgia, unspecified site: Secondary | ICD-10-CM | POA: Diagnosis not present
